# Patient Record
Sex: Female | Born: 2000 | Race: White | Hispanic: No | Marital: Single | Smoking: Never smoker
Health system: Southern US, Community
[De-identification: ages and names within clinical notes are randomized; demographics above are authoritative.]

## PROBLEM LIST (undated history)

## (undated) DIAGNOSIS — F419 Anxiety disorder, unspecified: Secondary | ICD-10-CM

## (undated) DIAGNOSIS — L309 Dermatitis, unspecified: Secondary | ICD-10-CM

## (undated) HISTORY — PX: WISDOM TOOTH EXTRACTION: SHX21

## (undated) HISTORY — DX: Dermatitis, unspecified: L30.9

## (undated) HISTORY — PX: OTHER SURGICAL HISTORY: SHX169

---
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
## 2019-01-24 ENCOUNTER — Other Ambulatory Visit: Payer: Self-pay

## 2019-01-24 DIAGNOSIS — Z20822 Contact with and (suspected) exposure to covid-19: Secondary | ICD-10-CM

## 2019-01-26 LAB — NOVEL CORONAVIRUS, NAA: SARS-CoV-2, NAA: NOT DETECTED

## 2019-01-27 ENCOUNTER — Telehealth: Payer: Self-pay

## 2019-01-27 NOTE — Telephone Encounter (Signed)
° °  Pt rec negative COVID results

## 2020-04-01 ENCOUNTER — Other Ambulatory Visit: Payer: Self-pay

## 2020-04-01 ENCOUNTER — Other Ambulatory Visit: Payer: Medicaid Other

## 2020-04-01 DIAGNOSIS — Z20822 Contact with and (suspected) exposure to covid-19: Secondary | ICD-10-CM

## 2020-04-03 LAB — SARS-COV-2, NAA 2 DAY TAT

## 2020-04-03 LAB — NOVEL CORONAVIRUS, NAA: SARS-CoV-2, NAA: NOT DETECTED

## 2021-03-24 NOTE — L&D Delivery Note (Addendum)
Delivery Note  First Stage: Labor onset: 0630 Augmentation : cytotec, AROM Analgesia /Anesthesia intrapartum: none AROM at 0925  Second Stage: Complete dilation at 0925 Onset of pushing at 0913 FHR second stage 135bpm, moderate variability, accels present, some variable decelerations present  Delivery of a viable female infant 11/03/2021 at 0948 by Donato Schultz, CNM delivery of fetal head in OA position with restitution to LOA. No nuchal cord;  Left nuchal hand present. Anterior then posterior shoulders delivered easily with gentle downward traction. Baby placed on mom's chest, and attended to by peds.  Cord double clamped after cessation of pulsation, cut by her mother. Cord blood sample collected   Third Stage: Placenta delivered Tomasa Blase intact with 3 VC @ [phone] Placenta disposition: discarded Uterine tone firm / bleeding scant  2nd degree laceration identified  Anesthesia for repair: lidocaine Repair 2-0 Vicryl Est. Blood Loss (mL):  Complications: none  Mom to postpartum.  Baby to Couplet care / Skin to Skin.  Newborn: Birth Weight: 7lb 13oz Apgar Scores: 8, 9 Feeding planned: breast

## 2021-04-02 ENCOUNTER — Other Ambulatory Visit: Payer: Self-pay

## 2021-04-02 ENCOUNTER — Ambulatory Visit (LOCAL_COMMUNITY_HEALTH_CENTER): Payer: Medicaid Other

## 2021-04-02 VITALS — BP 124/73 | Ht 62.0 in | Wt 156.5 lb

## 2021-04-02 DIAGNOSIS — Z3201 Encounter for pregnancy test, result positive: Secondary | ICD-10-CM | POA: Diagnosis not present

## 2021-04-02 LAB — PREGNANCY, URINE: Preg Test, Ur: POSITIVE — AB

## 2021-04-02 MED ORDER — PRENATAL 27-0.8 MG PO TABS: 1.0000 | ORAL_TABLET | Freq: Every day | ORAL | 0 refills | Status: AC

## 2021-04-02 NOTE — Progress Notes (Signed)
UPT positive. Plans prenatal care at [facility]. To DSS for Medicaid/preg women. Sitara Cashwell, RN  

## 2021-10-02 LAB — OB RESULTS CONSOLE GC/CHLAMYDIA
Chlamydia: NEGATIVE
Neisseria Gonorrhea: NEGATIVE

## 2021-10-02 LAB — OB RESULTS CONSOLE GBS: GBS: NEGATIVE

## 2021-10-02 LAB — OB RESULTS CONSOLE RPR: RPR: NONREACTIVE

## 2021-10-02 LAB — OB RESULTS CONSOLE HIV ANTIBODY (ROUTINE TESTING): HIV: NONREACTIVE

## 2021-10-28 ENCOUNTER — Other Ambulatory Visit: Payer: Self-pay | Admitting: Certified Nurse Midwife

## 2021-10-28 DIAGNOSIS — Z3490 Encounter for supervision of normal pregnancy, unspecified, unspecified trimester: Secondary | ICD-10-CM

## 2021-10-28 LAB — OB RESULTS CONSOLE VARICELLA ZOSTER ANTIBODY, IGG: Varicella: NON-IMMUNE/NOT IMMUNE

## 2021-10-28 LAB — OB RESULTS CONSOLE HEPATITIS B SURFACE ANTIGEN: Hepatitis B Surface Ag: NEGATIVE

## 2021-10-28 LAB — OB RESULTS CONSOLE RUBELLA ANTIBODY, IGM: Rubella: IMMUNE

## 2021-10-28 NOTE — Progress Notes (Signed)
G1P0000 at [redacted]w[redacted]d, LMP of 01/23/2021, c/w early [place] at [redacted]w[redacted]d.  Scheduled for induction of labor for term pregnancy on 11/03/2021.   Prenatal provider: [facility] Pregnancy complicated by: Chlamydia at NOB (resolved) Choroid plexus cyst (resolved) Varicella non-immune  Prenatal Labs: Blood type/Rh O pos  Antibody screen neg  Rubella Immune  Varicella NON-Immune  RPR NR  HBsAg Neg  HIV NR  GC neg  Chlamydia neg  Genetic screening cfDNA negative  1 hour GTT 110  3 hour GTT N/a  GBS neg   Tdap: given at [facility] Flu: n/a Contraception: n/a, not with partner Feeding preference: breastfeeding  ____ Janyce Llanos, CNM  Certified Nurse Midwife [facility] OB[facility]N [facility]

## 2021-11-03 ENCOUNTER — Encounter: Payer: Self-pay | Admitting: Obstetrics and Gynecology

## 2021-11-03 ENCOUNTER — Other Ambulatory Visit: Payer: Self-pay

## 2021-11-03 ENCOUNTER — Inpatient Hospital Stay
Admission: EM | Admit: 2021-11-03 | Discharge: 2021-11-04 | DRG: 807 | Disposition: A | Discharge status: Home / Self Care | Payer: Medicaid Other | Attending: Certified Nurse Midwife | Admitting: Certified Nurse Midwife

## 2021-11-03 DIAGNOSIS — Z7982 Long term (current) use of aspirin: Secondary | ICD-10-CM

## 2021-11-03 DIAGNOSIS — O480 Post-term pregnancy: Secondary | ICD-10-CM | POA: Diagnosis present

## 2021-11-03 DIAGNOSIS — O9081 Anemia of the puerperium: Secondary | ICD-10-CM | POA: Diagnosis not present

## 2021-11-03 DIAGNOSIS — Z3A40 40 weeks gestation of pregnancy: Secondary | ICD-10-CM | POA: Diagnosis not present

## 2021-11-03 DIAGNOSIS — Z23 Encounter for immunization: Secondary | ICD-10-CM

## 2021-11-03 DIAGNOSIS — Z3490 Encounter for supervision of normal pregnancy, unspecified, unspecified trimester: Principal | ICD-10-CM | POA: Diagnosis present

## 2021-11-03 LAB — CBC
HCT: 36.4 % (ref 36.0–46.0)
Hemoglobin: 12 g/dL (ref 12.0–15.0)
MCH: 28.1 pg (ref 26.0–34.0)
MCHC: 33 g/dL (ref 30.0–36.0)
MCV: 85.2 fL (ref 80.0–100.0)
Platelets: 221 10*3/uL (ref 150–400)
RBC: 4.27 MIL/uL (ref 3.87–5.11)
RDW: 14.2 % (ref 11.5–15.5)
WBC: 12.7 10*3/uL — ABNORMAL HIGH (ref 4.0–10.5)
nRBC: 0 % (ref 0.0–0.2)

## 2021-11-03 LAB — RPR: RPR Ser Ql: NONREACTIVE

## 2021-11-03 LAB — TYPE AND SCREEN
ABO/RH(D): O POS
Antibody Screen: NEGATIVE

## 2021-11-03 LAB — ABO/RH: ABO/RH(D): O POS

## 2021-11-03 MED ORDER — LIDOCAINE HCL (PF) 1 % IJ SOLN: 30.0000 mL | INTRAMUSCULAR | Status: AC | PRN

## 2021-11-03 MED ORDER — OXYCODONE HCL 5 MG PO TABS: 5.0000 mg | ORAL_TABLET | ORAL | Status: DC | PRN

## 2021-11-03 MED ORDER — LACTATED RINGERS IV SOLN: INTRAVENOUS | Status: DC

## 2021-11-03 MED ORDER — IBUPROFEN 600 MG PO TABS
600.0000 mg | ORAL_TABLET | Freq: Four times a day (QID) | ORAL | Status: DC
  Administered 2021-11-03 – 2021-11-04 (×5): 600 mg via ORAL
  Filled 2021-11-03 (×5): qty 1

## 2021-11-03 MED ORDER — WITCH HAZEL-GLYCERIN EX PADS
1.0000 | MEDICATED_PAD | CUTANEOUS | Status: DC | PRN
  Administered 2021-11-03: 1 via TOPICAL
  Filled 2021-11-03: qty 100

## 2021-11-03 MED ORDER — MISOPROSTOL 25 MCG QUARTER TABLET
25.0000 ug | ORAL_TABLET | ORAL | Status: DC
  Administered 2021-11-03 (×2): 25 ug via ORAL
  Filled 2021-11-03 (×2): qty 1

## 2021-11-03 MED ORDER — ONDANSETRON HCL 4 MG/2ML IJ SOLN: 4.0000 mg | INTRAMUSCULAR | Status: DC | PRN

## 2021-11-03 MED ORDER — TETANUS-DIPHTH-ACELL PERTUSSIS 5-2.5-18.5 LF-MCG/0.5 IM SUSY: 0.5000 mL | PREFILLED_SYRINGE | Freq: Once | INTRAMUSCULAR | Status: DC

## 2021-11-03 MED ORDER — ONDANSETRON HCL 4 MG PO TABS: 4.0000 mg | ORAL_TABLET | ORAL | Status: DC | PRN

## 2021-11-03 MED ORDER — SIMETHICONE 80 MG PO CHEW: 80.0000 mg | CHEWABLE_TABLET | ORAL | Status: DC | PRN

## 2021-11-03 MED ORDER — OXYTOCIN-SODIUM CHLORIDE 30-0.9 UT/500ML-% IV SOLN
2.5000 [IU]/h | INTRAVENOUS | Status: DC
  Administered 2021-11-03: 2.5 [IU]/h via INTRAVENOUS

## 2021-11-03 MED ORDER — VARICELLA VIRUS VACCINE LIVE 1350 PFU/0.5ML IJ SUSR
0.5000 mL | Freq: Once | INTRAMUSCULAR | Status: AC
  Administered 2021-11-04: 0.5 mL via SUBCUTANEOUS
  Filled 2021-11-03: qty 0.5

## 2021-11-03 MED ORDER — OXYTOCIN 10 UNIT/ML IJ SOLN
INTRAMUSCULAR | Status: AC
  Filled 2021-11-03: qty 2

## 2021-11-03 MED ORDER — MISOPROSTOL 25 MCG QUARTER TABLET
25.0000 ug | ORAL_TABLET | ORAL | Status: DC | PRN
  Administered 2021-11-03 (×2): 25 ug via VAGINAL
  Filled 2021-11-03 (×2): qty 1

## 2021-11-03 MED ORDER — MISOPROSTOL 200 MCG PO TABS
ORAL_TABLET | ORAL | Status: AC
  Filled 2021-11-03: qty 4

## 2021-11-03 MED ORDER — AMMONIA AROMATIC IN INHA
RESPIRATORY_TRACT | Status: AC
  Filled 2021-11-03: qty 10

## 2021-11-03 MED ORDER — DIPHENHYDRAMINE HCL 25 MG PO CAPS: 25.0000 mg | ORAL_CAPSULE | Freq: Four times a day (QID) | ORAL | Status: DC | PRN

## 2021-11-03 MED ORDER — LACTATED RINGERS IV SOLN: 500.0000 mL | INTRAVENOUS | Status: DC | PRN

## 2021-11-03 MED ORDER — SENNOSIDES-DOCUSATE SODIUM 8.6-50 MG PO TABS
2.0000 | ORAL_TABLET | Freq: Every day | ORAL | Status: DC
  Administered 2021-11-04: 2 via ORAL
  Filled 2021-11-03: qty 2

## 2021-11-03 MED ORDER — PRENATAL MULTIVITAMIN CH
1.0000 | ORAL_TABLET | Freq: Every day | ORAL | Status: DC
  Administered 2021-11-03 – 2021-11-04 (×2): 1 via ORAL
  Filled 2021-11-03 (×2): qty 1

## 2021-11-03 MED ORDER — TERBUTALINE SULFATE 1 MG/ML IJ SOLN: 0.2500 mg | Freq: Once | INTRAMUSCULAR | Status: DC | PRN

## 2021-11-03 MED ORDER — SOD CITRATE-CITRIC ACID 500-334 MG/5ML PO SOLN: 30.0000 mL | ORAL | Status: DC | PRN

## 2021-11-03 MED ORDER — FLUTICASONE PROPIONATE 50 MCG/ACT NA SUSP: 1.0000 | Freq: Every day | NASAL | Status: DC | PRN

## 2021-11-03 MED ORDER — DIBUCAINE (PERIANAL) 1 % EX OINT: 1.0000 | TOPICAL_OINTMENT | CUTANEOUS | Status: DC | PRN

## 2021-11-03 MED ORDER — FENTANYL CITRATE (PF) 100 MCG/2ML IJ SOLN: 50.0000 ug | INTRAMUSCULAR | Status: DC | PRN

## 2021-11-03 MED ORDER — ONDANSETRON HCL 4 MG/2ML IJ SOLN
4.0000 mg | Freq: Four times a day (QID) | INTRAMUSCULAR | Status: DC | PRN
  Administered 2021-11-03: 4 mg via INTRAVENOUS
  Filled 2021-11-03: qty 2

## 2021-11-03 MED ORDER — ACETAMINOPHEN 325 MG PO TABS: 650.0000 mg | ORAL_TABLET | ORAL | Status: DC | PRN

## 2021-11-03 MED ORDER — COCONUT OIL OIL: 1.0000 | TOPICAL_OIL | Status: DC | PRN

## 2021-11-03 MED ORDER — ACETAMINOPHEN 325 MG PO TABS
650.0000 mg | ORAL_TABLET | ORAL | Status: DC | PRN
  Administered 2021-11-03: 650 mg via ORAL
  Filled 2021-11-03: qty 2

## 2021-11-03 MED ORDER — LIDOCAINE HCL (PF) 1 % IJ SOLN
INTRAMUSCULAR | Status: AC
  Administered 2021-11-03: 30 mL via SUBCUTANEOUS
  Filled 2021-11-03: qty 30

## 2021-11-03 MED ORDER — ZOLPIDEM TARTRATE 5 MG PO TABS: 5.0000 mg | ORAL_TABLET | Freq: Every evening | ORAL | Status: DC | PRN

## 2021-11-03 MED ORDER — BENZOCAINE-MENTHOL 20-0.5 % EX AERO
1.0000 | INHALATION_SPRAY | CUTANEOUS | Status: DC | PRN
  Administered 2021-11-03: 1 via TOPICAL
  Filled 2021-11-03: qty 56

## 2021-11-03 MED ORDER — OXYTOCIN-SODIUM CHLORIDE 30-0.9 UT/500ML-% IV SOLN
INTRAVENOUS | Status: AC
  Administered 2021-11-03: 333 mL via INTRAVENOUS
  Filled 2021-11-03: qty 500

## 2021-11-03 MED ORDER — OXYTOCIN BOLUS FROM INFUSION: 333.0000 mL | Freq: Once | INTRAVENOUS | Status: AC

## 2021-11-03 MED ORDER — OXYTOCIN-SODIUM CHLORIDE 30-0.9 UT/500ML-% IV SOLN: 1.0000 m[IU]/min | INTRAVENOUS | Status: DC

## 2021-11-03 NOTE — Discharge Summary (Signed)
Obstetrical Discharge Summary  Patient Name: Lauren Todd DOB: [birth year] MRN: [id number]  Date of Admission: 11/03/2021 Date of Delivery: 11/03/2021 Delivered by: Donato Schultz, CNM Date of Discharge: 11/04/2021  Primary OB: [facility] [id number] last menstrual period was 01/23/2021 (exact date). EDC Estimated Date of Delivery: 10/30/21 Gestational Age at Delivery: [redacted]w[redacted]d   Antepartum complications:  Chlamydia at NOB (resolved) Choroid plexus cyst (resolved) Varicella non-immune Admitting Diagnosis: term IOL Secondary Diagnosis: Patient Active Problem List   Diagnosis Date Noted   Encounter for induction of labor 11/03/2021    Augmentation: AROM and Cytotec Complications: None Intrapartum complications/course: She arrived for IOL and received two doses of cytotec. Precipitous delivery, going from 2cm to delivery in 3 hours. 2nd degree laceration. Apgars 8/9. Date of Delivery: 11/03/2021 Delivered By: Donato Schultz, CNM Delivery Type: spontaneous vaginal delivery Anesthesia: none Placenta: spontaneous Laceration: 2nd degree Episiotomy: none Newborn Data: Live born female "Elissa" Birth Weight:  7#13 APGAR: 8, 9  Newborn Delivery   Birth date/time: 11/03/2021 09:48:00 Delivery type: Vaginal, Spontaneous      Postpartum Procedures: none  Edinburgh:     11/03/2021    3:22 PM  Edinburgh Postnatal Depression Scale Screening Tool  I have been able to laugh and see the funny side of things. 0  I have looked forward with enjoyment to things. 0  I have blamed myself unnecessarily when things went wrong. 0  I have been anxious or worried for no good reason. 1  I have felt scared or panicky for no good reason. 1  Things have been getting on top of me. 0  I have been so unhappy that I have had difficulty sleeping. 0  I have felt sad or miserable. 1  I have been so unhappy that I have been crying. 0  The thought of harming myself has occurred to me. 0   Edinburgh Postnatal Depression Scale Total 3    Post partum course:  Patient had an uncomplicated postpartum course.  By time of discharge on PPD#1, her pain was controlled on oral pain medications; she had appropriate lochia and was ambulating, voiding without difficulty and tolerating regular diet.  She was deemed stable for discharge to home.     Discharge Physical Exam:  BP 118/65 (BP Location: Left Arm)   Pulse 93   Temp 98.1 F (36.7 C) (Oral)   Resp 19   Ht 5\' 2"  (1.575 m)   Wt 80.3 kg   LMP 01/23/2021 (Exact Date)   SpO2 99%   Breastfeeding Unknown   BMI 32.37 kg/m   General: NAD CV: RRR Pulm: CTABL, nl effort ABD: s/nd/nt, fundus firm and below the umbilicus Lochia: small Perineum: well approximated DVT Evaluation: LE non-ttp, no evidence of DVT on exam.  Hemoglobin  Date Value Ref Range Status  11/04/2021 10.9 (L) 12.0 - 15.0 g/dL Final   HCT  Date Value Ref Range Status  11/04/2021 33.5 (L) 36.0 - 46.0 % Final     Disposition: stable, discharge to home. Baby Feeding: breastmilk Baby Disposition: home with mom  Rh Immune globulin given: n/a, O pos Rubella vaccine given: immune Varicella vaccine given: non-immune; offered at DC Tdap vaccine given in AP or PP setting: given at [facility] Flu vaccine given in AP or PP setting: out of season  Contraception: declined  Prenatal Labs:  Blood type/Rh O pos  Antibody screen neg  Rubella Immune  Varicella NON-Immune  RPR NR  HBsAg Neg  HIV NR  GC neg  Chlamydia  neg  Genetic screening cfDNA negative  1 hour GTT 110  3 hour GTT N/a  GBS neg     Plan:  Lauren Todd was discharged to home in good condition. Follow-up appointment with delivering provider in 2 weeks.  Discharge Medications: Allergies as of 11/04/2021       Reactions   Cat Hair Extract Hives, Rash        Medication List     STOP taking these medications    aspirin EC 81 MG tablet       TAKE these medications     acetaminophen 325 MG tablet Commonly known as: Tylenol Take 2 tablets (650 mg total) by mouth every 4 (four) hours as needed (for pain scale < 4).   benzocaine-Menthol 20-0.5 % Aero Commonly known as: DERMOPLAST Apply 1 Application topically as needed for irritation (perineal discomfort).   coconut oil Oil Apply 1 Application topically as needed.   diphenhydrAMINE 25 mg capsule Commonly known as: BENADRYL Take 1 capsule (25 mg total) by mouth every 6 (six) hours as needed for itching.   fluticasone 50 MCG/ACT nasal spray Commonly known as: FLONASE Place into both nostrils daily. What changed: Another medication with the same name was added. Make sure you understand how and when to take each.   fluticasone 50 MCG/ACT nasal spray Commonly known as: FLONASE Place 1 spray into both nostrils daily as needed for allergies or rhinitis (cold or allergy symptoms). What changed: You were already taking a medication with the same name, and this prescription was added. Make sure you understand how and when to take each.   ibuprofen 600 MG tablet Commonly known as: ADVIL Take 1 tablet (600 mg total) by mouth every 6 (six) hours.   multivitamin-prenatal 27-0.8 MG Tabs tablet Take 1 tablet by mouth daily at 12 noon.   senna-docusate 8.6-50 MG tablet Commonly known as: Senokot-S Take 2 tablets by mouth daily.   simethicone 80 MG chewable tablet Commonly known as: MYLICON Chew 1 tablet (80 mg total) by mouth as needed for flatulence.   witch hazel-glycerin pad Commonly known as: TUCKS Apply 1 Application topically as needed for hemorrhoids.         Follow-up Information     Janyce Llanos, CNM Follow up in 6 week(s).   Specialty: Certified Nurse Midwife Why: 6wk postpartum Contact information: [address] [place] [address] [phone]         [facility] Follow up in 2 week(s).   Why: postpartum mood check Contact information: [address] [place] [address] [phone]                Signed:  Randa Ngo, CNM 11/04/2021 8:26 AM

## 2021-11-03 NOTE — H&P (Signed)
OB History & Physical   History of Present Illness:  Chief Complaint:   HPI:  Lauren Todd is a 21 y.o. G1P0000 female at [redacted]w[redacted]d dated by LMP.  She presents to L&D for IOL for post-dates  Active FM onset of ctx @ 0030 currently every 2-3 minutes   Pregnancy Issues: Chlamydia at NOB (resolved) Choroid plexus cyst (resolved) Varicella non-immune   Maternal Medical History:   Past Medical History:  Diagnosis Date   Eczema     Past Surgical History:  Procedure Laterality Date   denies     WISDOM TOOTH EXTRACTION Bilateral     Allergies  Allergen Reactions   Cat Hair Extract Hives and Rash    Prior to Admission medications   Medication Sig Start Date End Date Taking? Authorizing Provider  aspirin EC 81 MG tablet Take 81 mg by mouth daily. Swallow whole.   Yes [provider]  fluticasone (FLONASE) 50 MCG/ACT nasal spray Place into both nostrils daily.   Yes [provider]  Prenatal Vit-Fe Fumarate-FA (MULTIVITAMIN-PRENATAL) 27-0.8 MG TABS tablet Take 1 tablet by mouth daily at 12 noon.   Yes [provider]     Prenatal care site: [facility]  Social History: She  reports that she has never smoked. She has never used smokeless tobacco. She reports that she does not drink alcohol and does not use drugs.  Family History: family history is not on file.   Review of Systems: A full review of systems was performed and negative except as noted in the HPI.     Physical Exam:  Vital Signs: BP (!) 116/98 (BP Location: Left Arm)   Pulse 86   Temp 97.9 F (36.6 C) (Oral)   Resp 18   Ht 5\' 2"  (1.575 m)   Wt 80.3 kg   LMP 01/23/2021 (Exact Date)   BMI 32.37 kg/m  General: no acute distress.  HEENT: normocephalic, atraumatic Heart: regular rate & rhythm.  No murmurs/rubs/gallops Lungs: clear to auscultation bilaterally, normal respiratory effort Abdomen: soft, gravid, non-tender;  EFW: 7.5lb Pelvic:   External: Normal external  female genitalia  Cervix: Dilation: 2 / Effacement (%): 60 / Station: 0    Extremities: non-tender, symmetric, mild edema bilaterally.  DTRs: +2  Neurologic: Alert & oriented x 3.    Results for orders placed or performed during the hospital encounter of 11/03/21 (from the past 24 hour(s))  CBC     Status: Abnormal   Collection Time: 11/03/21 12:34 AM  Result Value Ref Range   WBC 12.7 (H) 4.0 - 10.5 K/uL   RBC 4.27 3.87 - 5.11 MIL/uL   Hemoglobin 12.0 12.0 - 15.0 g/dL   HCT 11/05/21 48.5 - 46.2 %   MCV 85.2 80.0 - 100.0 fL   MCH 28.1 26.0 - 34.0 pg   MCHC 33.0 30.0 - 36.0 g/dL   RDW 70.3 50.0 - 93.8 %   Platelets 221 150 - 400 K/uL   nRBC 0.0 0.0 - 0.2 %  Type and screen     Status: None   Collection Time: 11/03/21 12:34 AM  Result Value Ref Range   ABO/RH(D) O POS    Antibody Screen NEG    Sample Expiration      11/06/2021,2359 Performed at [facility], [address] [place], [place]   ABO/Rh     Status: None   Collection Time: 11/03/21 12:41 AM  Result Value Ref Range   ABO/RH(D)  O POS Performed at [facility], [address] [place], [place] [address]     Pertinent Results:  Prenatal Labs: Blood type/Rh O pos  Antibody screen neg  Rubella Immune  Varicella Non-Immune  RPR NR  HBsAg Neg  HIV NR  GC neg  Chlamydia neg  Genetic screening negative  1 hour GTT 110  3 hour GTT N/a  GBS neg   FHT: 135bpm, moderate variability, accels present, no decelerations TOCO: q2-39min, palpate moderate SVE:  Dilation: 2 / Effacement (%): 60 / Station: 0    Cephalic by leopolds  No results found.  Assessment:  Lauren Todd is a 21 y.o. G1P0000 female at [redacted]w[redacted]d with IOL for post-term.   Plan:  1. Admit to Labor & Delivery; consents reviewed and obtained  2. Fetal Well being  - Fetal Tracing: Cat I tracing - Group B Streptococcus ppx indicated: n/a, GBS negative, intact - Presentation: vertex confirmed by SVE   3. Routine  OB: - Prenatal labs reviewed, as above - Rh pos - CBC, T&S, RPR on admit - Clear fluids, saline lock  4. Induction of Labor -  Contractions q2-47min, external toco in place -  Plan for induction with Cytotec, pitocin, AROM -  Plan for continuous fetal monitoring  -  Maternal pain control as desired; requesting unmedicated options - Anticipate vaginal delivery  5. Post Partum Planning: - Infant feeding: breastfeeding - Contraception: none   Janyce Llanos, CNM 11/03/21 8:55 AM

## 2021-11-04 LAB — CBC
HCT: 33.5 % — ABNORMAL LOW (ref 36.0–46.0)
Hemoglobin: 10.9 g/dL — ABNORMAL LOW (ref 12.0–15.0)
MCH: 28.3 pg (ref 26.0–34.0)
MCHC: 32.5 g/dL (ref 30.0–36.0)
MCV: 87 fL (ref 80.0–100.0)
Platelets: 238 10*3/uL (ref 150–400)
RBC: 3.85 MIL/uL — ABNORMAL LOW (ref 3.87–5.11)
RDW: 14.4 % (ref 11.5–15.5)
WBC: 17.4 10*3/uL — ABNORMAL HIGH (ref 4.0–10.5)
nRBC: 0 % (ref 0.0–0.2)

## 2021-11-04 MED ORDER — ACETAMINOPHEN 325 MG PO TABS
650.0000 mg | ORAL_TABLET | ORAL | Status: AC | PRN
Start: 2021-11-04 — End: ?

## 2021-11-04 MED ORDER — IBUPROFEN 600 MG PO TABS: 600.0000 mg | ORAL_TABLET | Freq: Four times a day (QID) | ORAL | 0 refills | Status: AC

## 2021-11-04 MED ORDER — COCONUT OIL OIL: 1.0000 | TOPICAL_OIL | 0 refills | Status: AC | PRN

## 2021-11-04 MED ORDER — FLUTICASONE PROPIONATE 50 MCG/ACT NA SUSP
1.0000 | Freq: Every day | NASAL | 0 refills | Status: AC | PRN
Start: 2021-11-04 — End: 2021-12-04

## 2021-11-04 MED ORDER — BENZOCAINE-MENTHOL 20-0.5 % EX AERO: 1.0000 | INHALATION_SPRAY | CUTANEOUS | Status: AC | PRN

## 2021-11-04 MED ORDER — SIMETHICONE 80 MG PO CHEW
80.0000 mg | CHEWABLE_TABLET | ORAL | 0 refills | Status: AC | PRN
Start: 2021-11-04 — End: ?

## 2021-11-04 MED ORDER — WITCH HAZEL-GLYCERIN EX PADS: 1.0000 | MEDICATED_PAD | CUTANEOUS | 12 refills | Status: AC | PRN

## 2021-11-04 MED ORDER — SENNOSIDES-DOCUSATE SODIUM 8.6-50 MG PO TABS: 2.0000 | ORAL_TABLET | Freq: Every day | ORAL | 0 refills | Status: AC

## 2021-11-04 MED ORDER — DIPHENHYDRAMINE HCL 25 MG PO CAPS: 25.0000 mg | ORAL_CAPSULE | Freq: Four times a day (QID) | ORAL | 0 refills | Status: AC | PRN

## 2021-11-04 NOTE — Progress Notes (Signed)
Post Partum Day 1 Subjective: Doing well, no complaints.  Tolerating regular diet, pain with PO meds, voiding and ambulating without difficulty.  No CP SOB Fever,Chills, N/V or leg pain; denies nipple or breast pain; no HA change of vision, RUQ/epigastric pain  Objective: BP 118/65 (BP Location: Left Arm)   Pulse 93   Temp 98.1 F (36.7 C) (Oral)   Resp 19   Ht 5\' 2"  (1.575 m)   Wt 80.3 kg   LMP 01/23/2021 (Exact Date)   SpO2 99%   Breastfeeding Unknown   BMI 32.37 kg/m   Vitals:   11/03/21 1100 11/03/21 1115 11/03/21 1130 11/03/21 1145  BP: (!) 121/56 (!) 138/58 115/61 118/64   11/03/21 1200 11/03/21 1215 11/03/21 1352 11/03/21 1522  BP: (!) 124/53 (!) 124/54 124/69 128/70   11/03/21 1910 11/03/21 2343 11/04/21 0315 11/04/21 0759  BP: 116/69 121/64 (!) 119/55 118/65      Physical Exam:  General: NAD Breasts: soft/nontender CV: RRR Pulm: nl effort Abdomen: soft, NT, BS x 4 Perineum: minimal edema, laceration repair well approximated Lochia: small Uterine Fundus: fundus firm and 2 fb below umbilicus DVT Evaluation: no cords, ttp LEs   Recent Labs    11/03/21 0034 11/04/21 0510  HGB 12.0 10.9*  HCT 36.4 33.5*  WBC 12.7* 17.4*  PLT 221 238    Assessment/Plan: 20 y.o. G1P1001 postpartum day # 1  - Continue routine PP care - Lactation consult - pt undecided on contraception - Acute blood loss anemia - hemodynamically stable and asymptomatic; start po ferrous sulfate BID with stool softeners  - Immunization status: Needs varicella prior to DC   Disposition: Does desire Dc home today.     11/06/21, CNM 11/04/2021  8:18 AM

## 2021-11-04 NOTE — Lactation Note (Signed)
This note was copied from a baby's chart. Lactation Consultation Note  Patient Name: Lauren Todd [id number] Date: 11/04/2021 Reason for consult: Initial assessment;Primapara;Term Age:22 hours  Maternal Data  This is mom's first baby, NSVD. Mom's plan is to breastfeed her baby.  Has patient been taught Hand Expression?: Yes Does the patient have breastfeeding experience prior to this delivery?: No  Feeding Mother's Current Feeding Choice: Breast Milk Provided mom tips and strategies to maximize latch technique. Mom reports she has been able to position and latch baby. Per mom baby has been breastfeeding well.  Interventions Interventions: Breast feeding basics reviewed;Hand express;Education  Discharge Discharge Education: Engorgement and breast care;Warning signs for feeding baby;Other (comment) (Mom will bring baby for follow-up to [facility] clinic.Provided mom with outpatient [facility] lactation contact information.) Pump: Personal (Mom reports she has a personal DEBP for home use.)  Consult Status Consult Status: PRN  Update provided to care nurse.  Fuller Song 11/04/2021, 10:15 AM

## 2021-11-04 NOTE — Progress Notes (Signed)
Mother discharged. Discharge instructions given. Mother verbalizes understanding. Transported by axillary.  

## 2022-03-27 ENCOUNTER — Emergency Department
Admission: EM | Admit: 2022-03-27 | Discharge: 2022-03-27 | Disposition: A | Discharge status: Home / Self Care | Payer: Medicaid Other | Attending: Emergency Medicine | Admitting: Emergency Medicine

## 2022-03-27 ENCOUNTER — Encounter: Payer: Self-pay | Admitting: Emergency Medicine

## 2022-03-27 ENCOUNTER — Other Ambulatory Visit: Payer: Self-pay

## 2022-03-27 DIAGNOSIS — D72829 Elevated white blood cell count, unspecified: Secondary | ICD-10-CM | POA: Insufficient documentation

## 2022-03-27 DIAGNOSIS — R519 Headache, unspecified: Secondary | ICD-10-CM | POA: Insufficient documentation

## 2022-03-27 HISTORY — DX: Anxiety disorder, unspecified: F41.9

## 2022-03-27 LAB — COMPREHENSIVE METABOLIC PANEL
ALT: 11 U/L (ref 0–44)
AST: 8 U/L — ABNORMAL LOW (ref 15–41)
Albumin: 4.7 g/dL (ref 3.5–5.0)
Alkaline Phosphatase: 91 U/L (ref 38–126)
Anion gap: 10 (ref 5–15)
BUN: 9 mg/dL (ref 6–20)
CO2: 23 mmol/L (ref 22–32)
Calcium: 9.4 mg/dL (ref 8.9–10.3)
Chloride: 104 mmol/L (ref 98–111)
Creatinine, Ser: 0.49 mg/dL (ref 0.44–1.00)
GFR, Estimated: >60 mL/min (ref >60)
Glucose, Bld: 95 mg/dL (ref 70–99)
Potassium: 4.1 mmol/L (ref 3.5–5.1)
Sodium: 137 mmol/L (ref 135–145)
Total Bilirubin: 0.7 mg/dL (ref 0.3–1.2)
Total Protein: 8.1 g/dL (ref 6.5–8.1)

## 2022-03-27 LAB — CBC WITH DIFFERENTIAL/PLATELET
Abs Immature Granulocytes: 0.01 10*3/uL (ref 0.00–0.07)
Basophils Absolute: 0 10*3/uL (ref 0.0–0.1)
Basophils Relative: 0 %
Eosinophils Absolute: 0 10*3/uL (ref 0.0–0.5)
Eosinophils Relative: 1 %
HCT: 40.9 % (ref 36.0–46.0)
Hemoglobin: 13.5 g/dL (ref 12.0–15.0)
Immature Granulocytes: 0 %
Lymphocytes Relative: 24 %
Lymphs Abs: 1.5 10*3/uL (ref 0.7–4.0)
MCH: 28 pg (ref 26.0–34.0)
MCHC: 33 g/dL (ref 30.0–36.0)
MCV: 84.7 fL (ref 80.0–100.0)
Monocytes Absolute: 0.4 10*3/uL (ref 0.1–1.0)
Monocytes Relative: 6 %
Neutro Abs: 4.6 10*3/uL (ref 1.7–7.7)
Neutrophils Relative %: 69 %
Platelets: 233 10*3/uL (ref 150–400)
RBC: 4.83 MIL/uL (ref 3.87–5.11)
RDW: 12.7 % (ref 11.5–15.5)
WBC: 6.5 10*3/uL (ref 4.0–10.5)
nRBC: 0 % (ref 0.0–0.2)

## 2022-03-27 LAB — URINALYSIS, ROUTINE W REFLEX MICROSCOPIC
Bilirubin Urine: NEGATIVE
Glucose, UA: NEGATIVE mg/dL
Hgb urine dipstick: NEGATIVE
Ketones, ur: NEGATIVE mg/dL
Nitrite: NEGATIVE
Protein, ur: NEGATIVE mg/dL
Specific Gravity, Urine: 1.005 (ref 1.005–1.030)
pH: 7 (ref 5.0–8.0)

## 2022-03-27 LAB — LIPASE, BLOOD: Lipase: 29 U/L (ref 11–51)

## 2022-03-27 LAB — POC URINE PREG, ED: Preg Test, Ur: NEGATIVE

## 2022-03-27 MED ORDER — KETOROLAC TROMETHAMINE 15 MG/ML IJ SOLN
15.0000 mg | Freq: Once | INTRAMUSCULAR | Status: AC
  Administered 2022-03-27: 15 mg via INTRAVENOUS
  Filled 2022-03-27: qty 1

## 2022-03-27 MED ORDER — METOCLOPRAMIDE HCL 5 MG/ML IJ SOLN
10.0000 mg | Freq: Once | INTRAMUSCULAR | Status: AC
  Administered 2022-03-27: 10 mg via INTRAVENOUS
  Filled 2022-03-27: qty 2

## 2022-03-27 MED ORDER — SODIUM CHLORIDE 0.9 % IV BOLUS
1000.0000 mL | Freq: Once | INTRAVENOUS | Status: AC
  Administered 2022-03-27: 1000 mL via INTRAVENOUS

## 2022-03-27 NOTE — ED Provider Notes (Signed)
[facility] Provider Note    Event Date/Time   First MD Initiated Contact with Patient 03/27/22 1235     (approximate)   History   No chief complaint on file.   HPI  Lauren Todd is a 22 y.o. female who presents today for evaluation of headache.  She reports that this has been intermittent for the past 11 days.  She is unsure of any exacerbating or relieving factors.  She reports that it is bitemporal and across her forehead.  She denies any changes in her vision, vomiting, weakness or numbness.  No dizziness or tinnitus.  No difficulty ambulating or speaking.  She delivered a baby 4 months ago, no complications.  She is not breast-feeding.  She reports that she has had "knots" in bilateral upper shoulders and feels that her soreness may be contributing to her symptoms.  Patient Active Problem List   Diagnosis Date Noted   Encounter for induction of labor 11/03/2021          Physical Exam   Triage Vital Signs: ED Triage Vitals  Enc Vitals Group     BP 03/27/22 1125 124/69     Pulse Rate 03/27/22 1125 95     Resp 03/27/22 1125 16     Temp 03/27/22 1125 98.4 F (36.9 C)     Temp Source 03/27/22 1125 Oral     SpO2 03/27/22 1125 97 %     Weight 03/27/22 1124 177 lb 0.5 oz (80.3 kg)     Height 03/27/22 1124 5\' 2"  (1.575 m)     Head Circumference --      Peak Flow --      Pain Score 03/27/22 1123 5     Pain Loc --      Pain Edu? --      Excl. in [place]? --     Most recent vital signs: Vitals:   03/27/22 1125  BP: 124/69  Pulse: 95  Resp: 16  Temp: 98.4 F (36.9 C)  SpO2: 97%    Physical Exam Vitals and nursing note reviewed.  Constitutional:      General: Awake and alert. No acute distress.    Appearance: Normal appearance. The patient is overweight.  HENT:     Head: Normocephalic and atraumatic.     Mouth: Mucous membranes are moist.  Eyes:     General: PERRL. Normal and full EOMs. No ptosis        Right eye: No discharge.         Left eye: No discharge.     Conjunctiva/sclera: Conjunctivae normal.  Cardiovascular:     Rate and Rhythm: Normal rate and regular rhythm.     Pulses: Normal pulses.  Pulmonary:     Effort: Pulmonary effort is normal. No respiratory distress.     Breath sounds: Normal breath sounds.  Abdominal:     Abdomen is soft. There is no abdominal tenderness. No rebound or guarding. No distention. Musculoskeletal:        General: No swelling. Normal range of motion.     Cervical back: Normal range of motion and neck supple.  Skin:    General: Skin is warm and dry.     Capillary Refill: Capillary refill takes less than 2 seconds.     Findings: No rash.  Neurological:     Mental Status: The patient is awake and alert.  Neurological: GCS 15 alert and oriented x3 Normal speech, no expressive or receptive aphasia or dysarthria  Cranial nerves II through XII intact Normal visual fields 5 out of 5 strength in all 4 extremities with intact sensation throughout No extremity drift Normal finger-to-nose testing, no limb or truncal ataxia      ED Results / Procedures / Treatments   Labs (all labs ordered are listed, but only abnormal results are displayed) Labs Reviewed  COMPREHENSIVE METABOLIC PANEL - Abnormal; Notable for the following components:      Result Value   AST 8 (*)    All other components within normal limits  URINALYSIS, ROUTINE W REFLEX MICROSCOPIC - Abnormal; Notable for the following components:   Color, Urine YELLOW (*)    APPearance HAZY (*)    Leukocytes,Ua MODERATE (*)    Bacteria, UA MANY (*)    All other components within normal limits  CBC WITH DIFFERENTIAL/PLATELET  LIPASE, BLOOD  POC URINE PREG, ED     EKG     RADIOLOGY     PROCEDURES:  Critical Care performed:   Procedures   MEDICATIONS ORDERED IN ED: Medications  sodium chloride 0.9 % bolus 1,000 mL (0 mLs Intravenous Stopped 03/27/22 1401)  ketorolac (TORADOL) 15 MG/ML injection 15 mg  (15 mg Intravenous Given 03/27/22 1305)  metoCLOPramide (REGLAN) injection 10 mg (10 mg Intravenous Given 03/27/22 1304)     IMPRESSION / MDM / ASSESSMENT AND PLAN / ED COURSE  I reviewed the triage vital signs and the nursing notes.   Differential diagnosis includes, but is not limited to, tension headache, migraine, post partum headache.  Patient is awake and alert, hemodynamically stable and afebrile.  She is nontoxic in appearance.  There are no focal neurological symptoms, no encephalopathy.  Pressures within normal limits, no protein in the urine, do not suspect postpartum preeclampsia.  Patient is also out of the postpartum window at this time.  No visual changes or cranial nerve deficits to suggest venous sinus thrombosis.  Headache resolved completely after medications, do not suspect pituitary hemorrhage.  Headache has been gradual in onset, without history or physical exam findings to suggest encephalopathy; no altered mental status, fever or meningismus, vision changes, vomiting or focal neurological deficit and resolved with treatment in the emergency department. Therefore, I have low suspicion for concerning process that would require urgent or emergent imaging or diagnostic/therapeutic procedural intervention such as lumbar puncture. Doubt meningitis as there is no fever, photophobia, neck symptoms, altered mental status. Additionally the patient is not known to be immunocompromised. No history of trauma, doubt subdural or epidural hematoma. No dizziness or other neurologic symptoms so cerebellar infarction or other hemorrhagic stroke are unlikely. Intracranial mass unlikely given that the headache is not getting progressively worse, is not worse in the morning, there are no other neurologic symptoms, and the neurologic exam is grossly normal. Unlikely to be giant cell arteritis as there is no tenderness over temporal artery or vision changes. Doubt CO toxicity as no known exposure and no other  family members have a headache. No neck pain and was not sudden onset or associated with movement of the neck and no dizziness, doubt carotid artery dissection. No occipital tenderness so occipital neuralgia seems less likely.   Labs are reassuring, no evidence of HELLP.  Urinalysis shows mild leukocytes and bacteria, the patient denies burning with urination, urinary urgency, and urinary frequency.  She admits to not using the moist towelette prior to giving sample, so I suspect a dirty sample.  She does not wish to be treated at this time.  Patient was  treated with Reglan, Toradol, and normal saline with complete resolution of her headache.  Upon reevaluation, patient reports that she feels ready for discharge.  I recommended close outpatient follow-up with her OB/GYN and strict return precautions.  We discussed return precautions at length.  Patient understands and agrees with plan.  She was discharged in stable condition.   Patient's presentation is most consistent with acute complicated illness / injury requiring diagnostic workup.   Clinical Course as of 03/27/22 1426  Thu Mar 27, 2022  1239 EKG 12-Lead [BS]  1347 On re-evaluation, patient reports headache has resolved completely [JP]    Clinical Course User Index [BS] Teodoro Spray, PA [JP] Jordynne Mccown, Clarnce Flock, PA-C     FINAL CLINICAL IMPRESSION(S) / ED DIAGNOSES   Final diagnoses:  Acute nonintractable headache, unspecified headache type     Rx / DC Orders   ED Discharge Orders     None        Note:  This document was prepared using Dragon voice recognition software and may include unintentional dictation errors.   Emeline Gins 03/27/22 1427    Vanessa , MD 03/29/22 2312

## 2022-03-27 NOTE — ED Triage Notes (Signed)
Headache x 11 days.  Also c/o intermittent chest pain and tongue numbness. States has history of anxiety.  AAOx3.  Skin warm and dry. NAD

## 2022-03-27 NOTE — Discharge Instructions (Signed)
Your blood work and EKG were normal.  You are given medicines to help with your headache.  Please return if you develop any new, worsening, or change in symptoms or other concerns including visual changes, vomiting, severe or worsening headache, dizziness, or any other concerns.  Please arrange follow-up with your OB/GYN.  It was a pleasure caring for you today.
